# Patient Record
Sex: Male | Born: 1985 | Race: White | Hispanic: No | Marital: Single | Smoking: Never smoker
Health system: Southern US, Community
[De-identification: ages and names within clinical notes are randomized; demographics above are authoritative.]

---

## 2009-07-18 ENCOUNTER — Ambulatory Visit: Payer: Self-pay | Admitting: Diagnostic Radiology

## 2009-07-18 ENCOUNTER — Emergency Department (HOSPITAL_BASED_OUTPATIENT_CLINIC_OR_DEPARTMENT_OTHER): Admission: EM | Admit: 2009-07-18 | Discharge: 2009-07-18 | Payer: Self-pay | Admitting: Emergency Medicine

## 2011-05-22 IMAGING — CR DG HAND COMPLETE 3+V*R*
3 series · 3 of 3 positions shown · non-contrast
Comparison: None

CLINICAL DATA: Laceration to index finger.  Pain at base of thumb
index finger.  Question foreign body.

RIGHT HAND - COMPLETE 3+ VIEW

[x hand pa right]
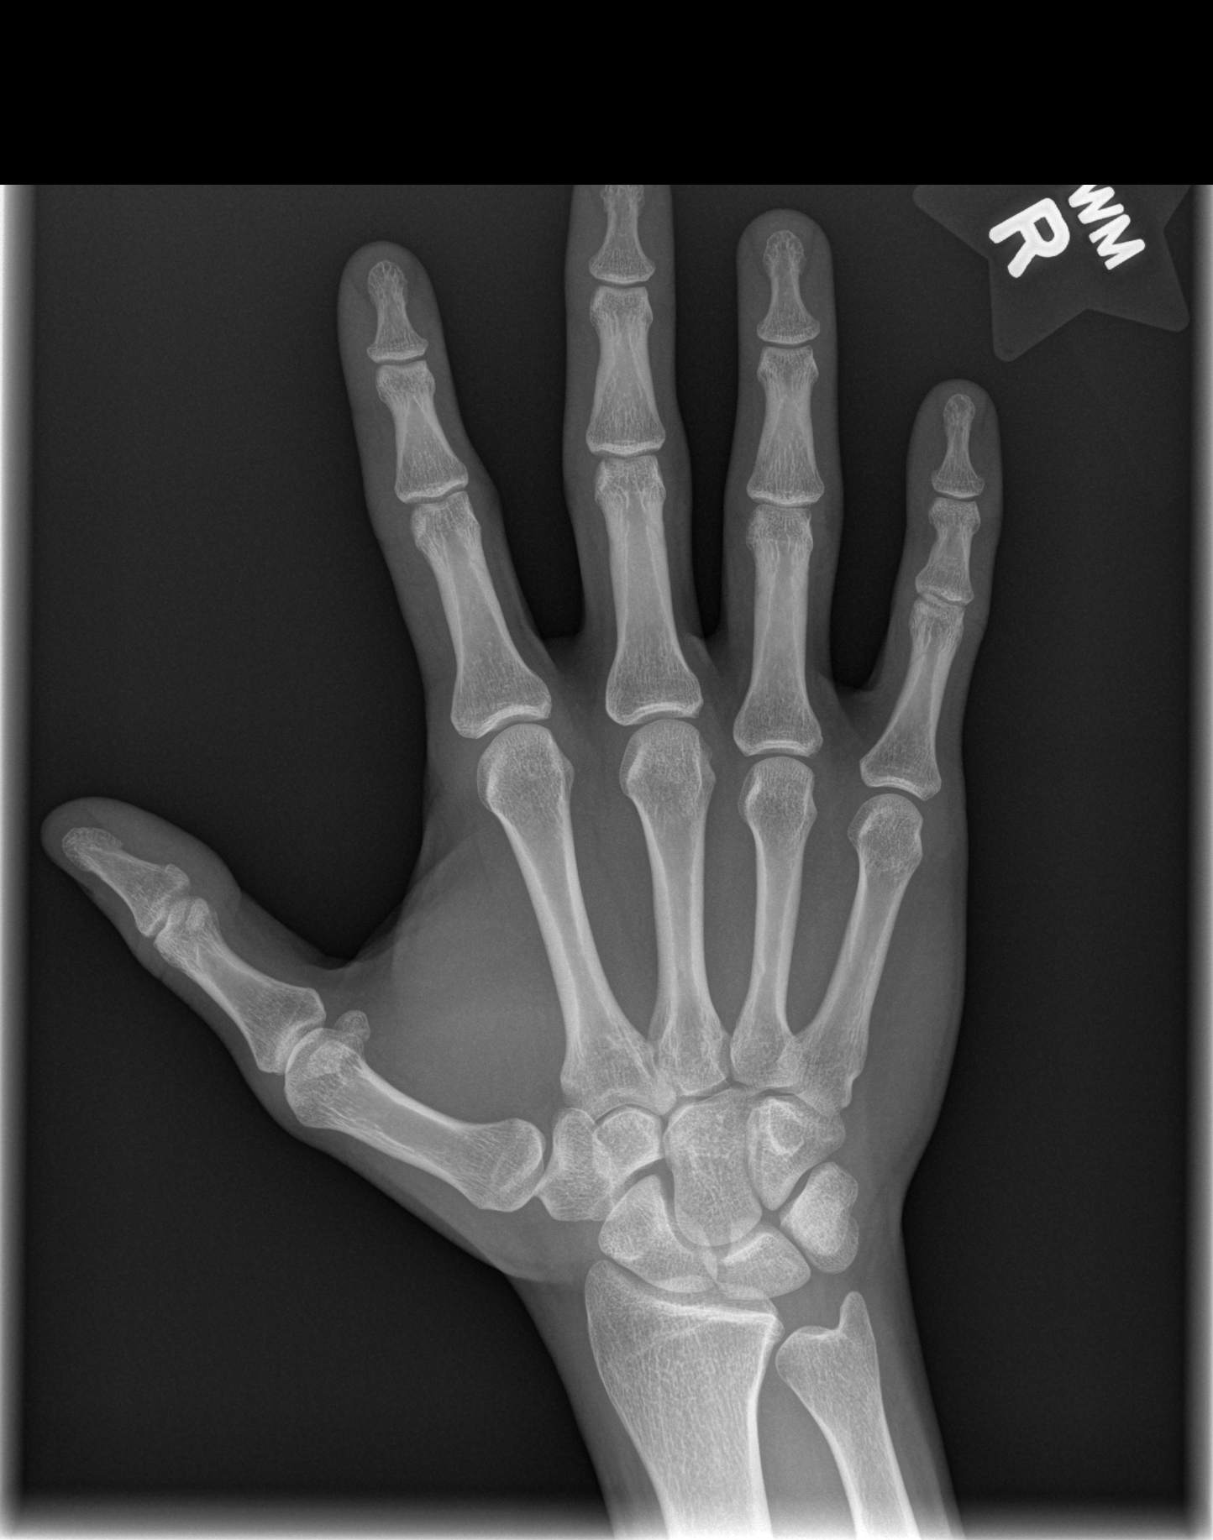

[x hand oblique right]
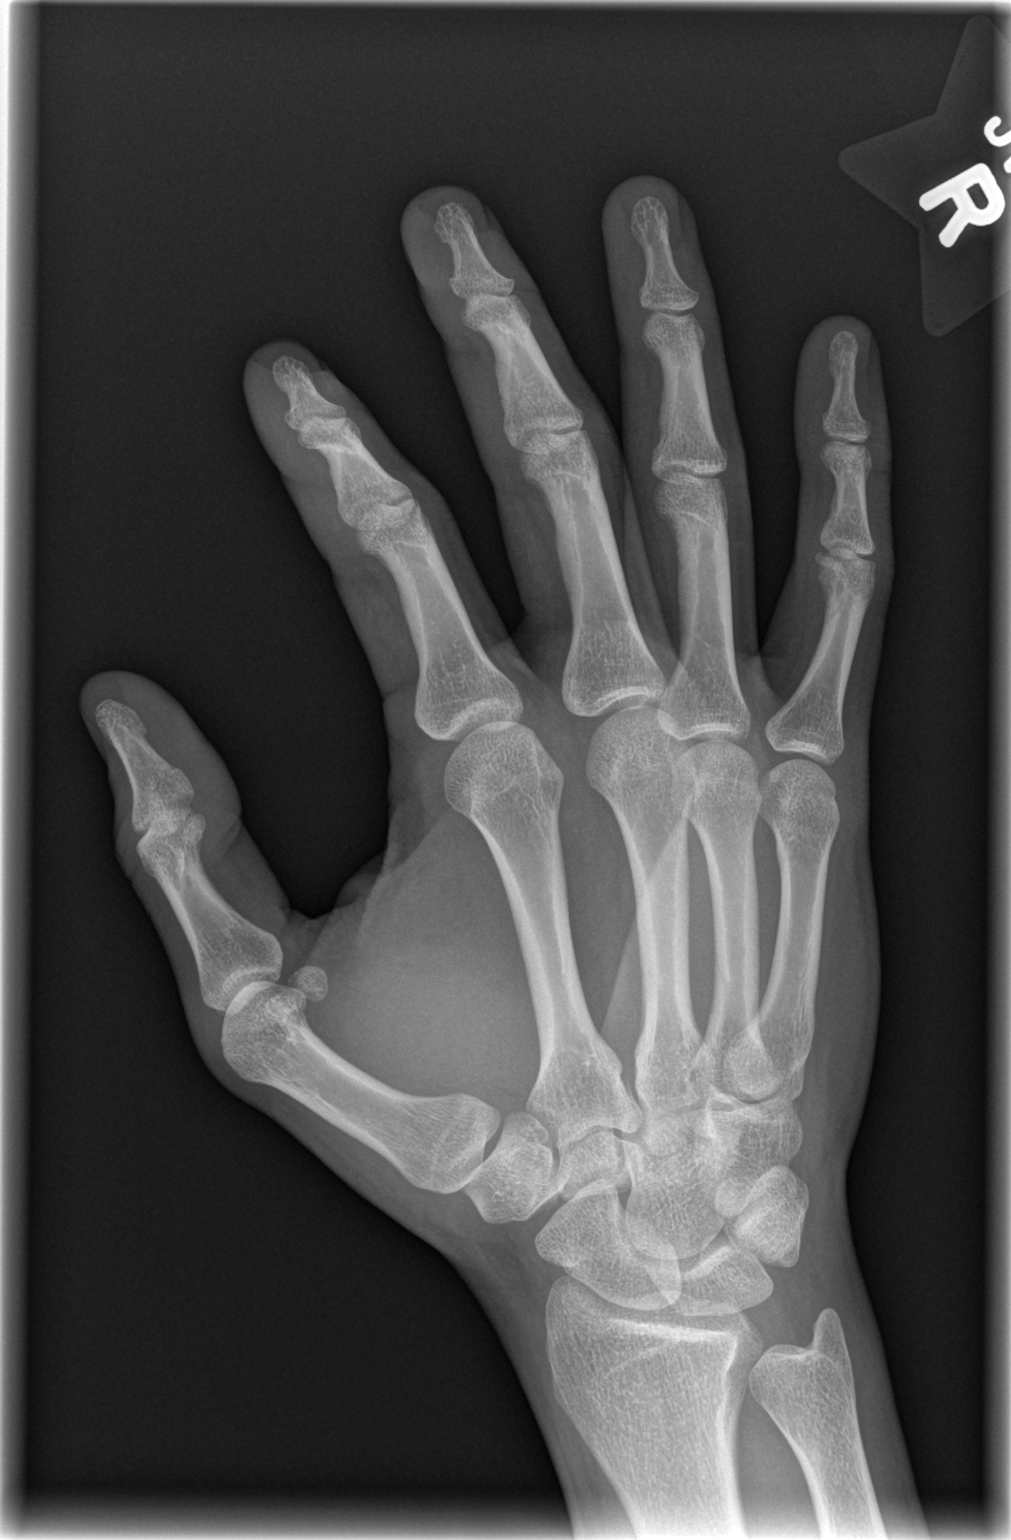

[x hand lat right]
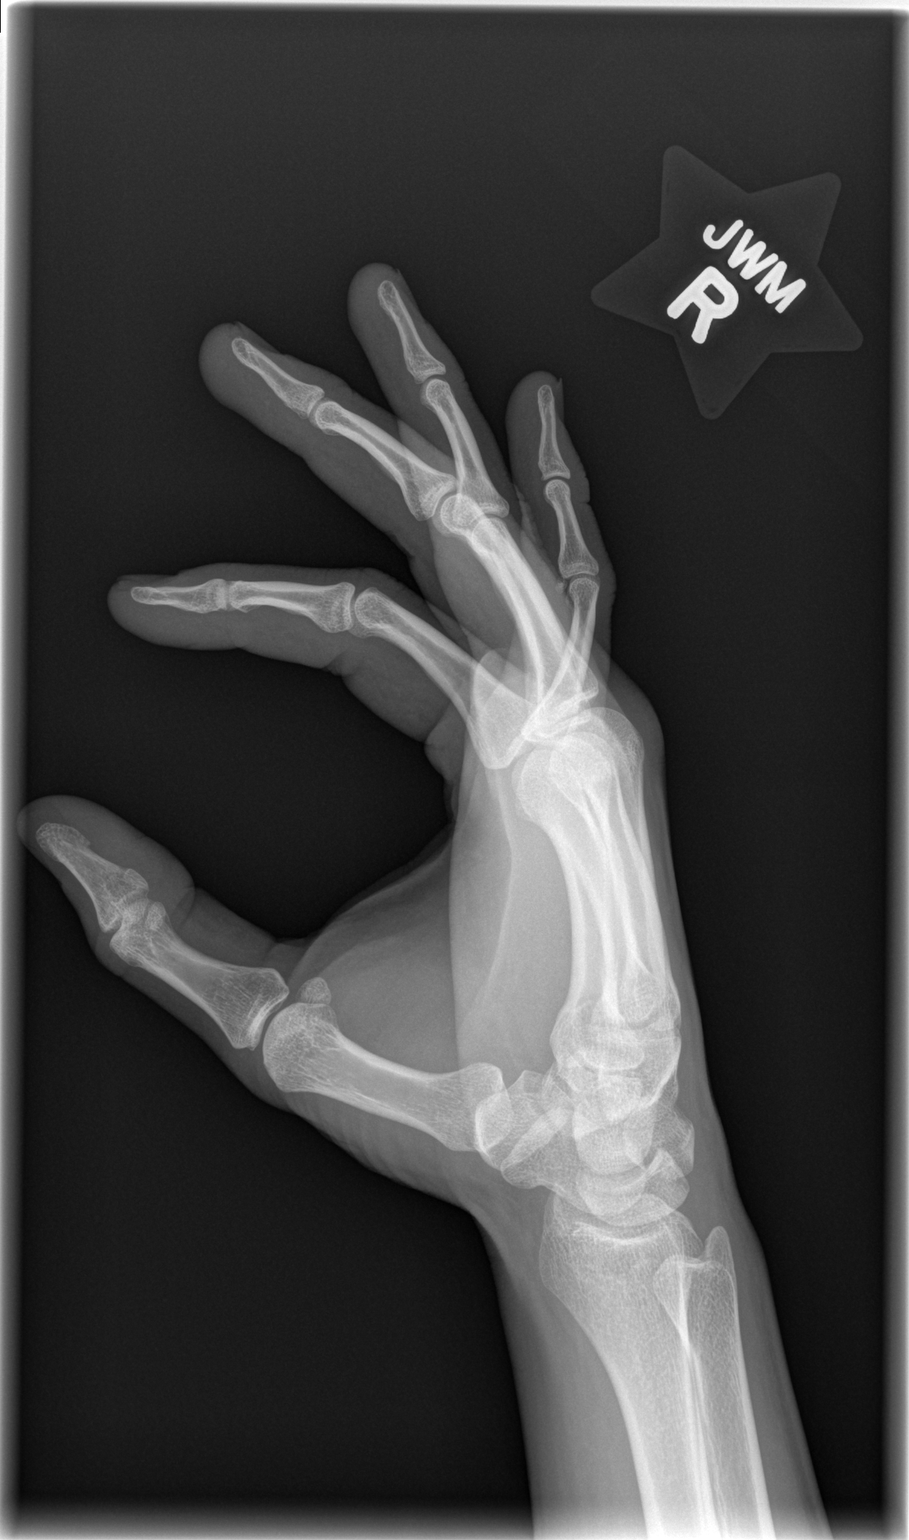

[3 of 3 positions shown; findings below may reference images not displayed]

FINDINGS: No acute fracture or dislocation.  No significant soft
tissue swelling.  No radiopaque foreign object.
IMPRESSION: No acute findings about the right hand.

## 2019-06-12 ENCOUNTER — Emergency Department (INDEPENDENT_AMBULATORY_CARE_PROVIDER_SITE_OTHER)
Admission: EM | Admit: 2019-06-12 | Discharge: 2019-06-12 | Disposition: A | Discharge status: Home / Self Care | Payer: BC Managed Care – PPO | Source: Home / Self Care | Attending: Family Medicine | Admitting: Family Medicine

## 2019-06-12 ENCOUNTER — Encounter: Payer: Self-pay | Admitting: Emergency Medicine

## 2019-06-12 ENCOUNTER — Other Ambulatory Visit: Payer: Self-pay

## 2019-06-12 DIAGNOSIS — Z4802 Encounter for removal of sutures: Secondary | ICD-10-CM

## 2019-06-12 NOTE — Discharge Instructions (Signed)
Apply Bacitracin ointment for 2 to 3 more days.

## 2019-06-12 NOTE — ED Triage Notes (Signed)
Patient here for suture removal from laceration over right eye/into eyebrow that was treated 6 days ago in another health system. He did receive a Tdap. He was not placed on any medication.

## 2019-06-12 NOTE — ED Provider Notes (Signed)
[facility]    CSN: [id number] Arrival date & time: 06/12/19  1150      History   Chief Complaint Chief Complaint  Patient presents with  . Suture / Staple Removal    HPI Madsen Riddle is a 33 y.o. male.   Patient presents for suture removal from laceration over right eye/eyebrow that was treated 6 days ago in another health system.  He was not placed on any medication.  He denies pain or drainage from the area and has no complaints.  The history is provided by the patient.    History reviewed. No pertinent past medical history.  There are no active problems to display for this patient.   History reviewed. No pertinent surgical history.     Home Medications    Prior to Admission medications   Not on File    Family History No family history on file.  Social History Social History   Tobacco Use  . Smoking status: Never Smoker  . Smokeless tobacco: Never Used  Substance Use Topics  . Alcohol use: Yes  . Drug use: Not on file     Allergies   Patient has no known allergies.   Review of Systems Review of Systems  Constitutional: Negative for chills, diaphoresis, fatigue and fever.  Skin: Positive for wound. Negative for color change.  All other systems reviewed and are negative.    Physical Exam Triage Vital Signs ED Triage Vitals [06/12/19 1242]  Enc Vitals Group     BP 121/83     Pulse Rate 63     Resp 16     Temp 98.1 F (36.7 C)     Temp Source Oral     SpO2 99 %     Weight 190 lb (86.2 kg)     Height 5\' 9"  (1.753 m)     Head Circumference      Peak Flow      Pain Score 0     Pain Loc      Pain Edu?      Excl. in GC?    No data found.  Updated Vital Signs BP 121/83 (BP Location: Right Arm)   Pulse 63   Temp 98.1 F (36.7 C) (Oral)   Resp 16   Ht 5\' 9"  (1.753 m)   Wt 86.2 kg   SpO2 99%   BMI 28.06 kg/m   Visual Acuity Right Eye Distance:   Left Eye Distance:   Bilateral Distance:    Right Eye Near:    Left Eye Near:    Bilateral Near:     Physical Exam Vitals signs and nursing note reviewed.  Constitutional:      General: He is not in acute distress. HENT:     Head: Normocephalic.      Comments: There is a well healed laceration right forehead extending into the eyebrow with 7 sutures in place.  No swelling, tenderness, erythema, or drainage.    Right Ear: External ear normal.     Left Ear: External ear normal.     Nose: Nose normal.     Mouth/Throat:     Pharynx: Oropharynx is clear.  Eyes:     Pupils: Pupils are equal, round, and reactive to light.  Cardiovascular:     Rate and Rhythm: Normal rate.  Pulmonary:     Effort: Pulmonary effort is normal.  Lymphadenopathy:     Cervical: No cervical adenopathy.  Skin:    General: Skin is warm  and dry.  Neurological:     Mental Status: He is alert.      UC Treatments / Results  Labs (all labs ordered are listed, but only abnormal results are displayed) Labs Reviewed - No data to display  EKG   Radiology No results found.  Procedures Procedures Suture removal. Removed 7 sutures from healed laceration right forehead/eyebrow as noted on diagram.    Medications Ordered in UC Medications - No data to display  Initial Impression / Assessment and Plan / UC Course  I have reviewed the triage vital signs and the nursing notes.  Pertinent labs & imaging results that were available during my care of the patient were reviewed by me and considered in my medical decision making (see chart for details).    Laceration appears well healed.   Final Clinical Impressions(s) / UC Diagnoses   Final diagnoses:  Visit for suture removal     Discharge Instructions     Apply Bacitracin ointment for 2 to 3 more days.    ED Prescriptions    None        Kandra Nicolas, MD 06/17/19 [phone]

## 2023-09-23 ENCOUNTER — Ambulatory Visit
Admission: EM | Admit: 2023-09-23 | Discharge: 2023-09-23 | Disposition: A | Discharge status: Home / Self Care | Payer: BC Managed Care – PPO | Attending: Family Medicine | Admitting: Family Medicine

## 2023-09-23 DIAGNOSIS — J309 Allergic rhinitis, unspecified: Secondary | ICD-10-CM | POA: Diagnosis not present

## 2023-09-23 DIAGNOSIS — J0100 Acute maxillary sinusitis, unspecified: Secondary | ICD-10-CM | POA: Diagnosis not present

## 2023-09-23 DIAGNOSIS — R059 Cough, unspecified: Secondary | ICD-10-CM | POA: Diagnosis not present

## 2023-09-23 DIAGNOSIS — R0981 Nasal congestion: Secondary | ICD-10-CM | POA: Diagnosis not present

## 2023-09-23 MED ORDER — PREDNISONE 20 MG PO TABS: ORAL_TABLET | ORAL | 0 refills | Status: AC

## 2023-09-23 MED ORDER — AMOXICILLIN-POT CLAVULANATE 875-125 MG PO TABS: 1.0000 | ORAL_TABLET | Freq: Two times a day (BID) | ORAL | 0 refills | Status: AC

## 2023-09-23 MED ORDER — BENZONATATE 200 MG PO CAPS: 200.0000 mg | ORAL_CAPSULE | Freq: Three times a day (TID) | ORAL | 0 refills | Status: AC | PRN

## 2023-09-23 MED ORDER — FEXOFENADINE HCL 180 MG PO TABS: 180.0000 mg | ORAL_TABLET | Freq: Every day | ORAL | 0 refills | Status: AC

## 2023-09-23 NOTE — ED Triage Notes (Signed)
 [facility] with [facility] cough. Pt reports he was sick on new years with the cough still lingering. Pt reports he has tried dayquill nyquill musinex tylenol cold and flu.

## 2023-09-23 NOTE — ED Provider Notes (Signed)
 [facility]    CSN: [id number] Arrival date & time: 09/23/23  1643      History   Chief Complaint Chief Complaint  Patient presents with   Cough    HPI Victor Haney is a 38 y.o. male.   HPI 38 year old male presents with lingering cough for 1 week.  History reviewed. No pertinent past medical history.  There are no active problems to display for this patient.   History reviewed. No pertinent surgical history.     Home Medications    Prior to Admission medications   Medication Sig Start Date End Date Taking? Authorizing Provider  amoxicillin -clavulanate (AUGMENTIN ) 875-125 MG tablet Take 1 tablet by mouth every 12 (twelve) hours. 09/23/23  Yes Teddy Sharper, FNP  benzonatate  (TESSALON ) 200 MG capsule Take 1 capsule (200 mg total) by mouth 3 (three) times daily as needed for up to 7 days. 09/23/23 09/30/23 Yes Teddy Sharper, FNP  fexofenadine  (ALLEGRA  ALLERGY) 180 MG tablet Take 1 tablet (180 mg total) by mouth daily for 15 days. 09/23/23 10/08/23 Yes Teddy Sharper, FNP  predniSONE  (DELTASONE ) 20 MG tablet Take 3 tabs PO daily x 5 days. 09/23/23  Yes Teddy Sharper, FNP    Family History Family History  Problem Relation Age of Onset   Cancer Mother    Healthy Father     Social History Social History   Tobacco Use   Smoking status: Never   Smokeless tobacco: Never  Vaping Use   Vaping status: Never Used  Substance Use Topics   Alcohol use: Yes     Allergies   Patient has no known allergies.   Review of Systems Review of Systems  Respiratory:  Positive for cough.   All other systems reviewed and are negative.    Physical Exam Triage Vital Signs ED Triage Vitals  Encounter Vitals Group     BP      Systolic BP Percentile      Diastolic BP Percentile      Pulse      Resp      Temp      Temp src      SpO2      Weight      Height      Head Circumference      Peak Flow      Pain Score      Pain Loc      Pain Education       Exclude from Growth Chart    No data found.  Updated Vital Signs BP 124/84   Pulse 85   Temp 98.5 F (36.9 C)   Resp 16   SpO2 98%   Visual Acuity Right Eye Distance:   Left Eye Distance:   Bilateral Distance:    Right Eye Near:   Left Eye Near:    Bilateral Near:     Physical Exam Vitals and nursing note reviewed.  Constitutional:      Appearance: Normal appearance. He is normal weight.  HENT:     Head: Normocephalic and atraumatic.     Right Ear: Tympanic membrane and external ear normal.     Left Ear: Tympanic membrane and external ear normal.     Ears:     Comments: Significant eustachian tube dysfunction noted bilaterally    Nose:     Comments: Turbinates are erythematous/edematous    Mouth/Throat:     Mouth: Mucous membranes are moist.     Pharynx: Oropharynx is clear.  Comments: Significant amount of clear drainage of posterior oropharynx noted Eyes:     Extraocular Movements: Extraocular movements intact.     Conjunctiva/sclera: Conjunctivae normal.     Pupils: Pupils are equal, round, and reactive to light.  Cardiovascular:     Rate and Rhythm: Normal rate and regular rhythm.     Pulses: Normal pulses.     Heart sounds: Normal heart sounds.  Pulmonary:     Effort: Pulmonary effort is normal.     Breath sounds: Normal breath sounds. No wheezing, rhonchi or rales.     Comments: Infrequent nonproductive cough on exam Musculoskeletal:        General: Normal range of motion.     Cervical back: Normal range of motion and neck supple.  Skin:    General: Skin is warm and dry.  Neurological:     General: No focal deficit present.     Mental Status: He is alert and oriented to person, place, and time. Mental status is at baseline.  Psychiatric:        Mood and Affect: Mood normal.        Behavior: Behavior normal.      UC Treatments / Results  Labs (all labs ordered are listed, but only abnormal results are displayed) Labs Reviewed - No data to  display  EKG   Radiology No results found.  Procedures Procedures (including critical care time)  Medications Ordered in UC Medications - No data to display  Initial Impression / Assessment and Plan / UC Course  I have reviewed the triage vital signs and the nursing notes.  Pertinent labs & imaging results that were available during my care of the patient were reviewed by me and considered in my medical decision making (see chart for details).     MDM: 1.  Acute maxillary sinusitis, recurrence not specified-Rx'd Augmentin  875/125 mg tablet: Take 1 tablet twice daily x 7 days; 2.  Congestion of nasal sinus-Rx'd prednisone  20 mg tablet: Take 3 tabs p.o. daily x 5 days; 3.  Allergic rhinitis, unspecified seasonality, unspecified trigger-Rx'd Allegra  180 mg tablet: Take 1 tablet daily x 5 days, then as needed for concurrent postnasal drainage/drip; 4.  Cough, unspecified type-Rx'd Tessalon  200 mg capsules: Take 1 capsule 3 times daily, as needed for cough. Advised patient to take Allegra  and prednisone  with first dose of Augmentin  for the next 5 of 7 days.  Advised may use Allegra  as needed afterwards for concurrent postnasal drainage/drip.  Advised may use Tessalon  capsules daily or as needed for cough.  Encouraged to increase daily water intake to 64 ounces per day while taking these medications.  Advised if symptoms worsen and/or unresolved please follow-up with PCP or here for further evaluation.  Final Clinical Impressions(s) / UC Diagnoses   Final diagnoses:  Cough, unspecified type  Acute maxillary sinusitis, recurrence not specified  Congestion of nasal sinus  Allergic rhinitis, unspecified seasonality, unspecified trigger     Discharge Instructions      Advised patient to take medications as directed with food to completion.  Advised patient to take Allegra  and prednisone  with first dose of Augmentin  for the next 5 of 7 days.  Advised may use Allegra  as needed afterwards for  concurrent postnasal drainage/drip.  Advised may use Tessalon  capsules daily or as needed for cough.  Encouraged to increase daily water intake to 64 ounces per day while taking these medications.  Advised if symptoms worsen and/or unresolved please follow-up with PCP or here for  further evaluation.     ED Prescriptions     Medication Sig Dispense Auth. Provider   amoxicillin -clavulanate (AUGMENTIN ) 875-125 MG tablet Take 1 tablet by mouth every 12 (twelve) hours. 14 tablet Abad Manard, FNP   predniSONE  (DELTASONE ) 20 MG tablet Take 3 tabs PO daily x 5 days. 15 tablet Jackson Coffield, FNP   benzonatate  (TESSALON ) 200 MG capsule Take 1 capsule (200 mg total) by mouth 3 (three) times daily as needed for up to 7 days. 40 capsule Lucianna Ostlund, FNP   fexofenadine  (ALLEGRA  ALLERGY) 180 MG tablet Take 1 tablet (180 mg total) by mouth daily for 15 days. 15 tablet Gustin Zobrist, FNP      PDMP not reviewed this encounter.   Teddy Sharper, FNP 09/23/23 1727

## 2023-09-23 NOTE — Discharge Instructions (Addendum)
 Advised patient to take medications as directed with food to completion.  Advised patient to take Allegra  and prednisone  with first dose of Augmentin  for the next 5 of 7 days.  Advised may use Allegra  as needed afterwards for concurrent postnasal drainage/drip.  Advised may use Tessalon  capsules daily or as needed for cough.  Encouraged to increase daily water intake to 64 ounces per day while taking these medications.  Advised if symptoms worsen and/or unresolved please follow-up with PCP or here for further evaluation.
